# Patient Record
Sex: Female | Born: 1945 | Race: White | Hispanic: No | Smoking: Never smoker
Health system: Southern US, Community
[De-identification: ages and names within clinical notes are randomized; demographics above are authoritative.]

---

## 2016-05-08 ENCOUNTER — Encounter (HOSPITAL_COMMUNITY): Payer: Self-pay | Admitting: *Deleted

## 2016-05-08 ENCOUNTER — Emergency Department (HOSPITAL_COMMUNITY)
Admission: EM | Admit: 2016-05-08 | Discharge: 2016-05-09 | Disposition: A | Discharge status: Home / Self Care | Payer: Medicare Other | Attending: Emergency Medicine | Admitting: Emergency Medicine

## 2016-05-08 DIAGNOSIS — S79911A Unspecified injury of right hip, initial encounter: Secondary | ICD-10-CM | POA: Insufficient documentation

## 2016-05-08 DIAGNOSIS — T148 Other injury of unspecified body region: Secondary | ICD-10-CM | POA: Diagnosis not present

## 2016-05-08 DIAGNOSIS — S31811A Laceration without foreign body of right buttock, initial encounter: Secondary | ICD-10-CM

## 2016-05-08 DIAGNOSIS — W010XXA Fall on same level from slipping, tripping and stumbling without subsequent striking against object, initial encounter: Secondary | ICD-10-CM | POA: Insufficient documentation

## 2016-05-08 DIAGNOSIS — W19XXXA Unspecified fall, initial encounter: Secondary | ICD-10-CM

## 2016-05-08 DIAGNOSIS — Y939 Activity, unspecified: Secondary | ICD-10-CM | POA: Insufficient documentation

## 2016-05-08 DIAGNOSIS — S3992XA Unspecified injury of lower back, initial encounter: Secondary | ICD-10-CM | POA: Diagnosis present

## 2016-05-08 DIAGNOSIS — Z880 Allergy status to penicillin: Secondary | ICD-10-CM | POA: Diagnosis not present

## 2016-05-08 DIAGNOSIS — Y998 Other external cause status: Secondary | ICD-10-CM | POA: Insufficient documentation

## 2016-05-08 DIAGNOSIS — Z79899 Other long term (current) drug therapy: Secondary | ICD-10-CM | POA: Diagnosis not present

## 2016-05-08 DIAGNOSIS — Y92002 Bathroom of unspecified non-institutional (private) residence single-family (private) house as the place of occurrence of the external cause: Secondary | ICD-10-CM | POA: Diagnosis not present

## 2016-05-08 DIAGNOSIS — T148XXA Other injury of unspecified body region, initial encounter: Secondary | ICD-10-CM

## 2016-05-08 NOTE — ED Notes (Signed)
THE PT FELL WHILE ALONE IN AN APARTMENT PRIOR TO ARRIVAL.Marland Kitchen. UNKNOWN LOSS OF CONSCIOUSNESS.  SHE DENIES PAIN ANYWHERE BUT THE FRIEND WITH HER RE[PORTS THAT SHE SAYS NO TO EVERYTHING.  NO ONE KNOWS WHETHER SHE STRUCK HER HEAD.  VSITING HERE FROM [place]. SHE HAS A LACERATION IN HER GROIN ACCORDING TO HER FRIEND.  THE PT IS ALERT

## 2016-05-08 NOTE — ED Notes (Signed)
Not in room

## 2016-05-09 ENCOUNTER — Emergency Department (HOSPITAL_COMMUNITY): Payer: Medicare Other

## 2016-05-09 MED ORDER — ONDANSETRON 4 MG PO TBDP
8.0000 mg | ORAL_TABLET | Freq: Once | ORAL | Status: AC
  Administered 2016-05-09: 8 mg via ORAL
  Filled 2016-05-09: qty 2

## 2016-05-09 NOTE — ED Notes (Signed)
[facility] pt seen by EDP prior to RN assessment, see MD notes, pending orders.

## 2016-05-09 NOTE — ED Notes (Signed)
Back from xray, alert, NAD, calm, interactive, no changes.

## 2016-05-09 NOTE — ED Notes (Signed)
Patient transported to X-ray 

## 2016-05-09 NOTE — ED Provider Notes (Addendum)
CSN: [id number]     Arrival date & time 05/08/16  2040 History  By signing my name below, I, Ronney Lion, attest that this documentation has been prepared under the direction and in the presence of Derwood Kaplan, MD. Electronically Signed: Ronney Lion, ED Scribe. 05/09/2016. 2:10 AM.     Chief Complaint  Patient presents with  . Fall   The history is provided by the patient and a relative. No language interpreter was used.    HPI Comments: Elizabeth Meyers is a 70 y.o. female who presents to the Emergency Department s/p tripping and falling in the bathroom PTA. Patient states this was a purely mechanical fall and denies any preceding pre-syncopal symptoms. Her sons state her fall was unwitnessed.  she was initially complaining of pain mostly in her upper pelvic region after the fall. However, while in the exam room, she states she is presently not experiencing any pain. Her sons also note a "gash" on her posterior pelvic region. She states she was able to stand up and ambulate with assistance of her sons afterwards. She states she was able to take a few steps, but this increased her pain. She denies head injury, she denies neck pain, chest pain, abdominal pain, lower leg pain below the pain.  History reviewed. No pertinent past medical history. History reviewed. No pertinent past surgical history. No family history on file. Social History  Substance Use Topics  . Smoking status: Never Smoker   . Smokeless tobacco: None  . Alcohol Use: No   OB History    No data available     Review of Systems  All other systems reviewed and are negative.     Allergies  Penicillins  Home Medications   Prior to Admission medications   Medication Sig Start Date End Date Taking? Authorizing Provider  cinacalcet (SENSIPAR) 90 MG tablet Take 120 mg by mouth daily.   Yes Historical Provider, MD  citalopram (CELEXA) 10 MG tablet Take 10 mg by mouth daily.   Yes Historical Provider, MD   Fluticasone-Salmeterol (ADVAIR) 250-50 MCG/DOSE AEPB Inhale 1 puff into the lungs 2 (two) times daily as needed (shortness of breath).   Yes Historical Provider, MD  levETIRAcetam (KEPPRA) 250 MG tablet Take 250 mg by mouth 2 (two) times daily.   Yes Historical Provider, MD  methimazole (TAPAZOLE) 5 MG tablet Take 5 mg by mouth daily.   Yes Historical Provider, MD  valsartan (DIOVAN) 160 MG tablet Take 160 mg by mouth daily.   Yes Historical Provider, MD   BP 121/89 mmHg  Pulse 70  Temp(Src) 98.3 F (36.8 C)  Resp 18  Wt 125 lb 3 oz (56.785 kg)  SpO2 98% Physical Exam  Constitutional: She is oriented to person, place, and time. She appears well-developed and well-nourished. No distress.  HENT:  Head: Normocephalic and atraumatic.  Scalp exam reveals no bleeding or hematoma.   Eyes: Conjunctivae and EOM are normal.  Neck: Neck supple. No tracheal deviation present.  Cardiovascular: Normal rate and regular rhythm.   Pulmonary/Chest: Effort normal. No respiratory distress.  Lungs are clear to auscultation anteriorly.   Abdominal: Soft.  Abdomen is soft.  Musculoskeletal: Normal range of motion.  Upper extremity exam reveals no deformity or tenderness to palpation.  No midline C-spine tenderness. Pelvis is stable.  No gross deformity of the lower extremities. No ecchymosis appreciated. No focal tenderness.  Internal and external hip rotation of the RLE is intact.   Neurological: She is alert and oriented  to person, place, and time.  Skin: Skin is warm and dry.  0.2 cm laceration on posterior groin region. Not actively bleeding.  Psychiatric: She has a normal mood and affect. Her behavior is normal.  Nursing note and vitals reviewed.   ED Course  Wound repair Date/Time: 05/09/2016 2:15 AM Performed by: Derwood KaplanNANAVATI, Compton Brigance Authorized by: Derwood KaplanNANAVATI, Kynadee Dam Consent: Verbal consent obtained. Risks and benefits: risks, benefits and alternatives were discussed Consent given by:  patient Patient identity confirmed: arm band Time out: Immediately prior to procedure a "time out" was called to verify the correct patient, procedure, equipment, support staff and site/side marked as required. Comments: Steri strips applied the lesion in the gluteal region that measures 1 cm   (including critical care time)  DIAGNOSTIC STUDIES: Oxygen Saturation is 99% on RA, normal by my interpretation.    COORDINATION OF CARE: 12:20 AM - Discussed treatment plan with pt and her sons at bedside which includes hip x-ray. Will repair groin laceration with Derma-bond and Steri-strips. Pt and her sons verbalized understanding and agreed to plan.   Labs Review Labs Reviewed - No data to display  Imaging Review Dg Hip Unilat With Pelvis 2-3 Views Right  05/09/2016  CLINICAL DATA:  Status post fall, with right groin laceration. Initial encounter. EXAM: DG HIP (WITH OR WITHOUT PELVIS) 2-3V RIGHT COMPARISON:  None. FINDINGS: There is no evidence of fracture or dislocation. Both femoral heads are seated normally within their respective acetabula. The proximal right femur appears intact. Degenerative change is noted at the lower lumbar spine. The sacroiliac joints are unremarkable in appearance. The visualized bowel gas pattern is grossly unremarkable in appearance. Scattered phleboliths are noted within the pelvis. IMPRESSION: No evidence of fracture or dislocation. Electronically Signed   By: Roanna RaiderJeffery  Chang M.D.   On: 05/09/2016 01:44   I have personally reviewed and evaluated these images and lab results as part of my medical decision-making.   EKG Interpretation None      MDM   Final diagnoses:  Fall, initial encounter  Tear of skin of right buttock, initial encounter  Contusion    I personally performed the services described in this documentation, which was scribed in my presence. The recorded information has been reviewed and is accurate.  Pt comes in with cc of fall. PT ambulated  for me. Pt has hip pain, R side, we will get Xrays. She has a small laceration - 1 cm. Will apply steri strips.   Derwood KaplanAnkit Hughie Melroy, MD 05/09/16 [id number]  Derwood KaplanAnkit Kamrynn Melott, MD 05/09/16 [phone]

## 2016-05-09 NOTE — ED Notes (Signed)
Dr. Rhunette CroftNanavati into room, pt & family updated. No changes, NAD, calm, VSS, eating peanuts.

## 2016-05-09 NOTE — Discharge Instructions (Signed)
We saw you in the ER after you had a fall. All the imaging results are normal, no fractures seen. No evidence of brain bleed. Please be very careful with walking, and do everything possible to prevent falls.  You had a small skin tear that was repaired. No lotion/vaseline in that area.   Stitches, Staples, or Adhesive Wound Closure Health care providers use stitches (sutures), staples, and certain glue (skin adhesives) to hold skin together while it heals (wound closure). You may need this treatment after you have surgery or if you cut your skin accidentally. These methods help your skin to heal more quickly and make it less likely that you will have a scar. A wound may take several months to heal completely. The type of wound you have determines when your wound gets closed. In most cases, the wound is closed as soon as possible (primary skin closure). Sometimes, closure is delayed so the wound can be cleaned and allowed to heal naturally. This reduces the chance of infection. Delayed closure may be needed if your wound:  Is caused by a bite.  Happened more than 6 hours ago.  Involves loss of skin or the tissues under the skin.  Has dirt or debris in it that cannot be removed.  Is infected. WHAT ARE THE DIFFERENT KINDS OF WOUND CLOSURES? There are many options for wound closure. The one that your health care provider uses depends on how deep and how large your wound is. Adhesive Glue To use this type of glue to close a wound, your health care provider holds the edges of the wound together and paints the glue on the surface of your skin. You may need more than one layer of glue. Then the wound may be covered with a light bandage (dressing). This type of skin closure may be used for small wounds that are not deep (superficial). Using glue for wound closure is less painful than other methods. It does not require a medicine that numbs the area (local anesthetic). This method also leaves nothing  to be removed. Adhesive glue is often used for children and on facial wounds. Adhesive glue cannot be used for wounds that are deep, uneven, or bleeding. It is not used inside of a wound.  Adhesive Strips These strips are made of sticky (adhesive), porous paper. They are applied across your skin edges like a regular adhesive bandage. You leave them on until they fall off. Adhesive strips may be used to close very superficial wounds. They may also be used along with sutures to improve the closure of your skin edges.  Sutures Sutures are the oldest method of wound closure. Sutures can be made from natural substances, such as silk, or from synthetic materials, such as nylon and steel. They can be made from a material that your body can break down as your wound heals (absorbable), or they can be made from a material that needs to be removed from your skin (nonabsorbable). They come in many different strengths and sizes. Your health care provider attaches the sutures to a steel needle on one end. Sutures can be passed through your skin, or through the tissues beneath your skin. Then they are tied and cut. Your skin edges may be closed in one continuous stitch or in separate stitches. Sutures are strong and can be used for all kinds of wounds. Absorbable sutures may be used to close tissues under the skin. The disadvantage of sutures is that they may cause skin reactions that lead  to infection. Nonabsorbable sutures need to be removed. Staples When surgical staples are used to close a wound, the edges of your skin on both sides of the wound are brought close together. A staple is placed across the wound, and an instrument secures the edges together. Staples are often used to close surgical cuts (incisions). Staples are faster to use than sutures, and they cause less skin reaction. Staples need to be removed using a tool that bends the staples away from your skin. HOW DO I CARE FOR MY WOUND CLOSURE?  Take  medicines only as directed by your health care provider.  If you were prescribed an antibiotic medicine for your wound, finish it all even if you start to feel better.  Use ointments or creams only as directed by your health care provider.  Wash your hands with soap and water before and after touching your wound.  Do not soak your wound in water. Do not take baths, swim, or use a hot tub until your health care provider approves.  Ask your health care provider when you can start showering. Cover your wound if directed by your health care provider.  Do not take out your own sutures or staples.  Do not pick at your wound. Picking can cause an infection.  Keep all follow-up visits as directed by your health care provider. This is important. HOW LONG WILL I HAVE MY WOUND CLOSURE?  Leave adhesive glue on your skin until the glue peels away.  Leave adhesive strips on your skin until the strips fall off.  Absorbable sutures will dissolve within several days.  Nonabsorbable sutures and staples must be removed. The location of the wound will determine how long they stay in. This can range from several days to a couple of weeks. WHEN SHOULD I SEEK HELP FOR MY WOUND CLOSURE? Contact your health care provider if:  You have a fever.  You have chills.  You have drainage, redness, swelling, or pain at your wound.  There is a bad smell coming from your wound.  The skin edges of your wound start to separate after your sutures have been removed.  Your wound becomes thick, raised, and darker in color after your sutures come out (scarring).   This information is not intended to replace advice given to you by your health care provider. Make sure you discuss any questions you have with your health care provider.   Document Released: 08/25/2001 Document Revised: 12/21/2014 Document Reviewed: 05/09/2014 Elsevier Interactive Patient Education [employer]2016 [employer]

## 2017-10-25 IMAGING — DX DG HIP (WITH OR WITHOUT PELVIS) 2-3V*R*
3 series · 3 of 3 positions shown · non-contrast
Comparison: None.

CLINICAL DATA: Status post fall, with right groin laceration.
Initial encounter.

EXAM:
DG HIP (WITH OR WITHOUT PELVIS) 2-3V RIGHT

[pelvis ap]
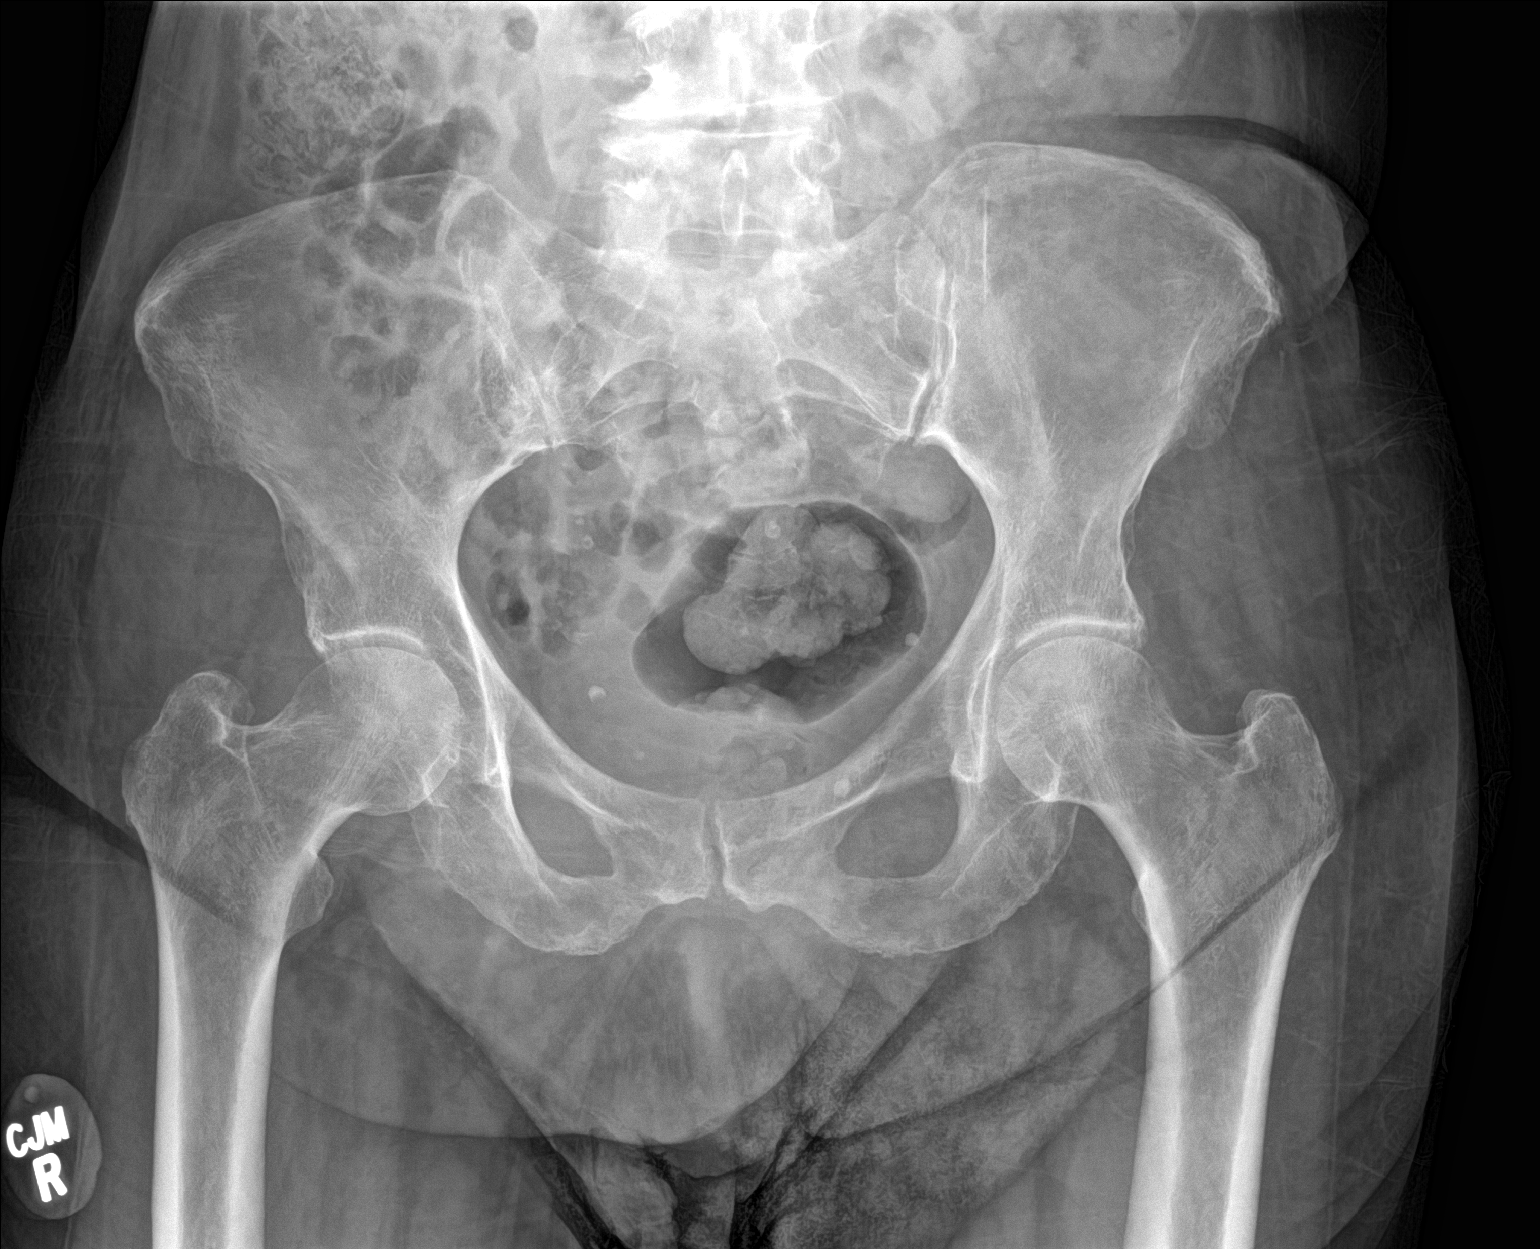

[hip ap]
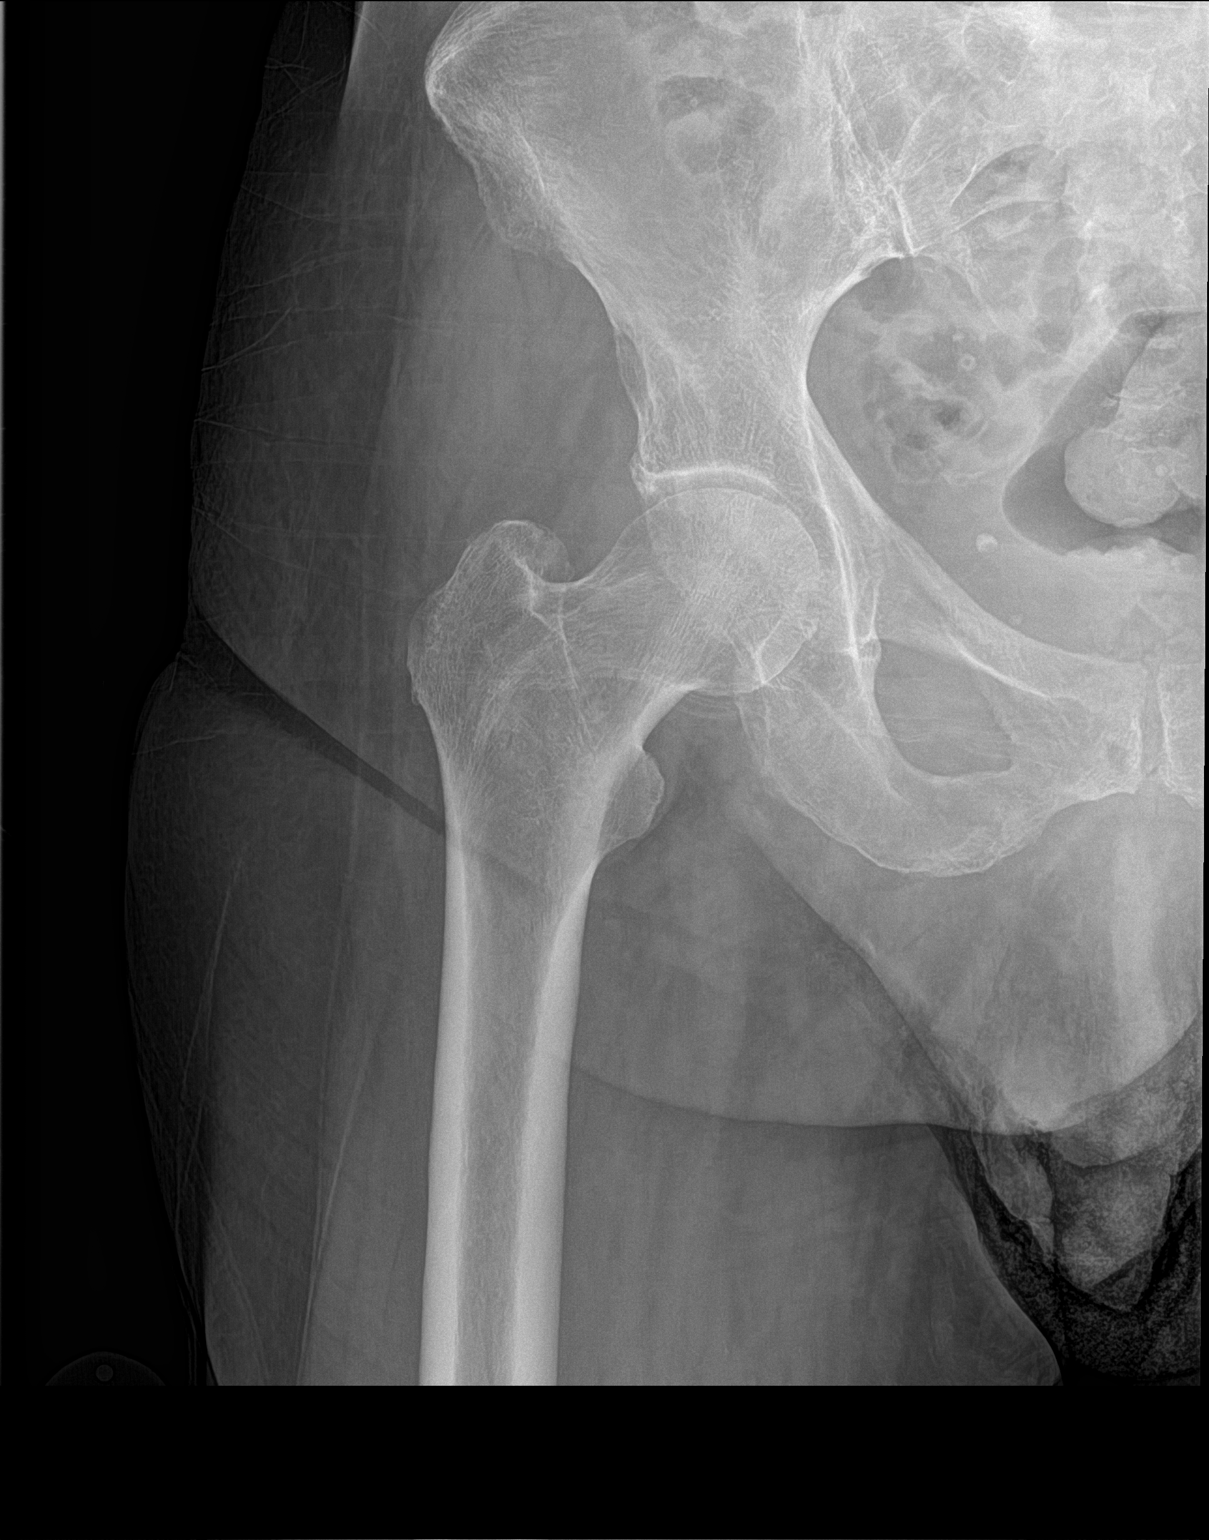

[hip lat]
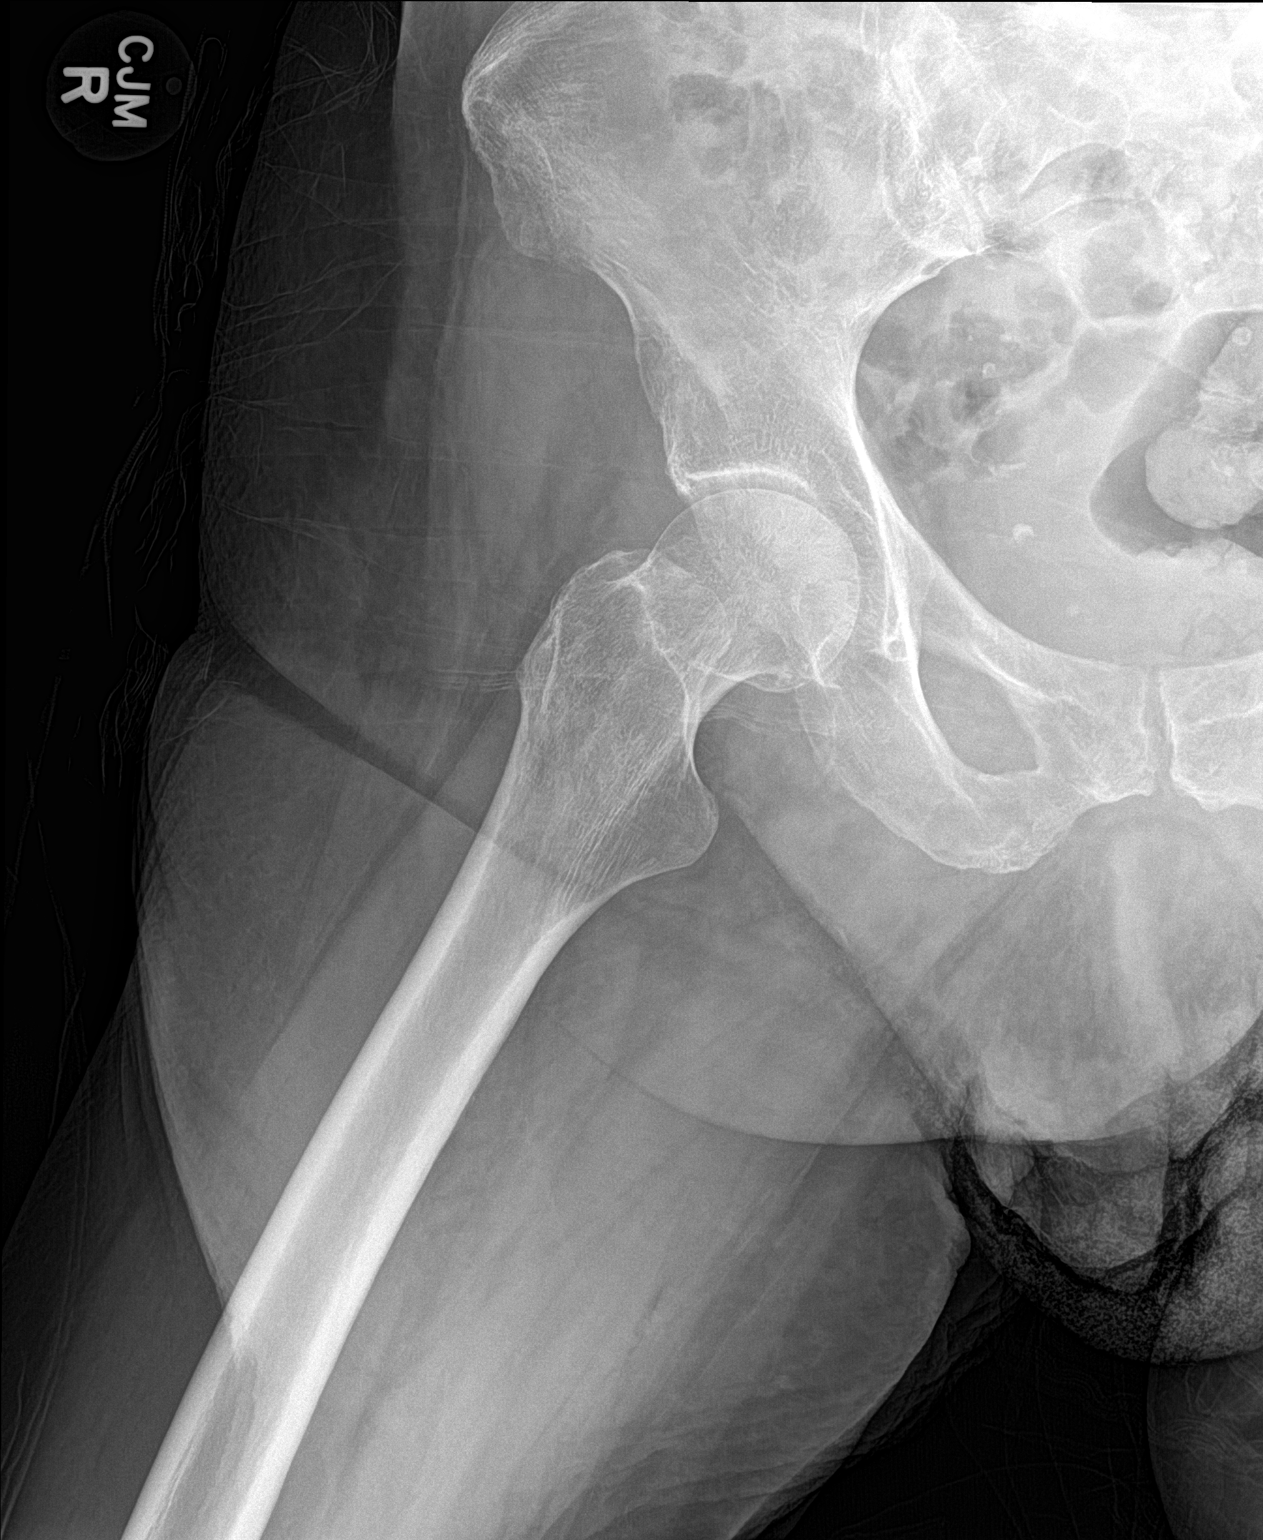

[3 of 3 positions shown; findings below may reference images not displayed]

FINDINGS: There is no evidence of fracture or dislocation. Both femoral heads
are seated normally within their respective acetabula. The proximal
right femur appears intact. Degenerative change is noted at the
lower lumbar spine. The sacroiliac joints are unremarkable in
appearance.

The visualized bowel gas pattern is grossly unremarkable in
appearance. Scattered phleboliths are noted within the pelvis.
IMPRESSION: No evidence of fracture or dislocation.

## 2020-05-14 DEATH — deceased
